# Patient Record
Sex: Male | Born: 1955 | Race: White | Hispanic: No | Marital: Married | Smoking: Never smoker
Health system: Southern US, Community
[De-identification: ages and names within clinical notes are randomized; demographics above are authoritative.]

## PROBLEM LIST (undated history)

## (undated) DIAGNOSIS — K219 Gastro-esophageal reflux disease without esophagitis: Secondary | ICD-10-CM

## (undated) DIAGNOSIS — I10 Essential (primary) hypertension: Secondary | ICD-10-CM

## (undated) DIAGNOSIS — N189 Chronic kidney disease, unspecified: Secondary | ICD-10-CM

---

## 2004-07-21 ENCOUNTER — Ambulatory Visit: Payer: Self-pay | Admitting: Gastroenterology

## 2015-05-28 ENCOUNTER — Encounter: Admission: RE | Payer: Self-pay | Source: Ambulatory Visit

## 2015-05-28 ENCOUNTER — Ambulatory Visit
Admission: RE | Admit: 2015-05-28 | Payer: BLUE CROSS/BLUE SHIELD | Source: Ambulatory Visit | Admitting: Gastroenterology

## 2015-05-28 SURGERY — COLONOSCOPY WITH PROPOFOL
Anesthesia: General

## 2015-07-29 ENCOUNTER — Encounter: Payer: Self-pay | Admitting: *Deleted

## 2015-07-30 ENCOUNTER — Ambulatory Visit: Payer: BLUE CROSS/BLUE SHIELD | Admitting: Anesthesiology

## 2015-07-30 ENCOUNTER — Encounter: Payer: Self-pay | Admitting: *Deleted

## 2015-07-30 ENCOUNTER — Encounter
Admission: RE | Disposition: A | Discharge status: Home / Self Care | Payer: Self-pay | Source: Ambulatory Visit | Attending: Gastroenterology

## 2015-07-30 ENCOUNTER — Ambulatory Visit
Admission: RE | Admit: 2015-07-30 | Discharge: 2015-07-30 | Disposition: A | Discharge status: Home / Self Care | Payer: BLUE CROSS/BLUE SHIELD | Source: Ambulatory Visit | Attending: Gastroenterology | Admitting: Gastroenterology

## 2015-07-30 DIAGNOSIS — Z79899 Other long term (current) drug therapy: Secondary | ICD-10-CM | POA: Diagnosis not present

## 2015-07-30 DIAGNOSIS — Z1211 Encounter for screening for malignant neoplasm of colon: Secondary | ICD-10-CM | POA: Insufficient documentation

## 2015-07-30 DIAGNOSIS — K5730 Diverticulosis of large intestine without perforation or abscess without bleeding: Secondary | ICD-10-CM | POA: Insufficient documentation

## 2015-07-30 DIAGNOSIS — N189 Chronic kidney disease, unspecified: Secondary | ICD-10-CM | POA: Insufficient documentation

## 2015-07-30 DIAGNOSIS — Z87442 Personal history of urinary calculi: Secondary | ICD-10-CM | POA: Insufficient documentation

## 2015-07-30 DIAGNOSIS — I129 Hypertensive chronic kidney disease with stage 1 through stage 4 chronic kidney disease, or unspecified chronic kidney disease: Secondary | ICD-10-CM | POA: Diagnosis not present

## 2015-07-30 DIAGNOSIS — K219 Gastro-esophageal reflux disease without esophagitis: Secondary | ICD-10-CM | POA: Diagnosis not present

## 2015-07-30 HISTORY — DX: Gastro-esophageal reflux disease without esophagitis: K21.9

## 2015-07-30 HISTORY — PX: COLONOSCOPY WITH PROPOFOL: SHX5780

## 2015-07-30 HISTORY — DX: Chronic kidney disease, unspecified: N18.9

## 2015-07-30 HISTORY — DX: Essential (primary) hypertension: I10

## 2015-07-30 SURGERY — COLONOSCOPY WITH PROPOFOL
Anesthesia: General

## 2015-07-30 MED ORDER — FENTANYL CITRATE (PF) 100 MCG/2ML IJ SOLN
INTRAMUSCULAR | Status: DC | PRN
  Administered 2015-07-30: 50 ug via INTRAVENOUS

## 2015-07-30 MED ORDER — SODIUM CHLORIDE 0.9 % IV SOLN
INTRAVENOUS | Status: DC
  Administered 2015-07-30: 11:00:00 via INTRAVENOUS

## 2015-07-30 MED ORDER — PROPOFOL 10 MG/ML IV BOLUS
INTRAVENOUS | Status: DC | PRN
  Administered 2015-07-30: 40 mg via INTRAVENOUS

## 2015-07-30 MED ORDER — PROPOFOL 500 MG/50ML IV EMUL
INTRAVENOUS | Status: DC | PRN
  Administered 2015-07-30: 120 ug/kg/min via INTRAVENOUS

## 2015-07-30 MED ORDER — MIDAZOLAM HCL 2 MG/2ML IJ SOLN
INTRAMUSCULAR | Status: DC | PRN
  Administered 2015-07-30: 1 mg via INTRAVENOUS

## 2015-07-30 MED ORDER — PHENYLEPHRINE HCL 10 MG/ML IJ SOLN
INTRAMUSCULAR | Status: DC | PRN
  Administered 2015-07-30: 100 ug via INTRAVENOUS

## 2015-07-30 MED ORDER — SODIUM CHLORIDE 0.9 % IV SOLN: INTRAVENOUS | Status: DC

## 2015-07-30 NOTE — Anesthesia Preprocedure Evaluation (Signed)
Anesthesia Evaluation  Patient identified by MRN, date of birth, ID band Patient awake    Reviewed: Allergy & Precautions, NPO status , Patient's Chart, lab work & pertinent test results  History of Anesthesia Complications Negative for: history of anesthetic complications  Airway Mallampati: II       Dental no notable dental hx.    Pulmonary neg pulmonary ROS,    breath sounds clear to auscultation       Cardiovascular Exercise Tolerance: Good hypertension, Pt. on medications  Rhythm:Regular Rate:Normal     Neuro/Psych    GI/Hepatic Neg liver ROS, GERD  Medicated,  Endo/Other  negative endocrine ROS  Renal/GU      Musculoskeletal   Abdominal Normal abdominal exam  (+)   Peds  Hematology   Anesthesia Other Findings   Reproductive/Obstetrics                             Anesthesia Physical Anesthesia Plan  ASA: I  Anesthesia Plan: General   Post-op Pain Management:    Induction: Intravenous  Airway Management Planned: Natural Airway and Nasal Cannula  Additional Equipment:   Intra-op Plan:   Post-operative Plan:   Informed Consent: I have reviewed the patients History and Physical, chart, labs and discussed the procedure including the risks, benefits and alternatives for the proposed anesthesia with the patient or authorized representative who has indicated his/her understanding and acceptance.     Plan Discussed with:   Anesthesia Plan Comments:         Anesthesia Quick Evaluation

## 2015-07-30 NOTE — Transfer of Care (Signed)
Immediate Anesthesia Transfer of Care Note  Patient: Philip Terry  Procedure(s) Performed: Procedure(s): COLONOSCOPY WITH PROPOFOL (N/A)  Patient Location: PACU  Anesthesia Type:General  Level of Consciousness: awake, alert  and oriented  Airway & Oxygen Therapy: Patient Spontanous Breathing and Patient connected to nasal cannula oxygen  Post-op Assessment: Report given to RN and Post -op Vital signs reviewed and stable  Post vital signs: Reviewed and stable  Last Vitals:  Filed Vitals:   07/30/15 1053  BP: 118/76  Pulse: 98  Temp: 36.1 C  Resp: 16    Complications: No apparent anesthesia complications

## 2015-07-30 NOTE — H&P (Signed)
    Primary Care Physician:  Rusty Aus., MD Primary Gastroenterologist:  Dr. Candace Cruise  Pre-Procedure History & Physical: HPI:  Philip Terry is a 60 y.o. male is here for an colonoscopy.   Past Medical History  Diagnosis Date  . GERD (gastroesophageal reflux disease)   . Hypertension   . Chronic kidney disease     Nephrolithiasis    History reviewed. No pertinent past surgical history.  Prior to Admission medications   Medication Sig Start Date End Date Taking? Authorizing Provider  lisinopril-hydrochlorothiazide (PRINZIDE,ZESTORETIC) 20-12.5 MG tablet Take 1 tablet by mouth daily.   Yes Historical Provider, MD    Allergies as of 05/31/2015  . (Not on File)    History reviewed. No pertinent family history.  Social History   Social History  . Marital Status: Married    Spouse Name: N/A  . Number of Children: N/A  . Years of Education: N/A   Occupational History  . Not on file.   Social History Main Topics  . Smoking status: Never Smoker   . Smokeless tobacco: Never Used  . Alcohol Use: No  . Drug Use: No  . Sexual Activity: Not on file   Other Topics Concern  . Not on file   Social History Narrative    Review of Systems: See HPI, otherwise negative ROS  Physical Exam: BP 118/76 mmHg  Pulse 98  Temp(Src) 97 F (36.1 C) (Tympanic)  Resp 16  Ht 5\' 6"  (1.676 m)  Wt 68.04 kg (150 lb)  BMI 24.22 kg/m2  SpO2 100% General:   Alert,  pleasant and cooperative in NAD Head:  Normocephalic and atraumatic. Neck:  Supple; no masses or thyromegaly. Lungs:  Clear throughout to auscultation.    Heart:  Regular rate and rhythm. Abdomen:  Soft, nontender and nondistended. Normal bowel sounds, without guarding, and without rebound.   Neurologic:  Alert and  oriented x4;  grossly normal neurologically.  Impression/Plan: Philip Terry is here for an colonoscopy to be performed for screening.  Risks, benefits, limitations, and alternatives regarding  colonoscopy  have been reviewed with the patient.  Questions have been answered.  All parties agreeable.   Margean Korell, Lupita Dawn, MD  07/30/2015, 10:55 AM

## 2015-07-30 NOTE — Op Note (Signed)
[facility] Gastroenterology Patient Name: Philip Terry Procedure Date: 07/30/2015 11:15 AM MRN: [id number] Account #: [id number] Date of Birth: 1955/09/16 Admit Type: Outpatient Age: 60 Room: [facility] ENDO ROOM 4 Gender: Male Note Status: Finalized Procedure:         Colonoscopy Indications:       Screening for colorectal malignant neoplasm Providers:         Lupita Dawn. Candace Cruise, MD Referring MD:      Rusty Aus, MD (Referring MD) Medicines:         Monitored Anesthesia Care Complications:     No immediate complications. Procedure:         Pre-Anesthesia Assessment:                    - Prior to the procedure, a History and Physical was                     performed, and patient medications, allergies and                     sensitivities were reviewed. The patient's tolerance of                     previous anesthesia was reviewed.                    - The risks and benefits of the procedure and the sedation                     options and risks were discussed with the patient. All                     questions were answered and informed consent was obtained.                    - After reviewing the risks and benefits, the patient was                     deemed in satisfactory condition to undergo the procedure.                    After obtaining informed consent, the colonoscope was                     passed under direct vision. Throughout the procedure, the                     patient's blood pressure, pulse, and oxygen saturations                     were monitored continuously. The Colonoscope was                     introduced through the anus and advanced to the the cecum,                     identified by appendiceal orifice and ileocecal valve. The                     colonoscopy was performed without difficulty. The patient                     tolerated the procedure well. The quality of the bowel  preparation was good. Findings:      A few  small-mouthed diverticula were found in the sigmoid colon.      The exam was otherwise without abnormality. Impression:        - Diverticulosis in the sigmoid colon.                    - The examination was otherwise normal.                    - No specimens collected. Recommendation:    - Discharge patient to home.                    - Repeat colonoscopy in 10 years for surveillance.                    - The findings and recommendations were discussed with the                     patient. Procedure Code(s): --- Professional ---                    [phone], Colonoscopy, flexible; diagnostic, including                     collection of specimen(s) by brushing or washing, when                     performed (separate procedure) Diagnosis Code(s): --- Professional ---                    Z12.11, Encounter for screening for malignant neoplasm of                     colon                    K57.30, Diverticulosis of large intestine without                     perforation or abscess without bleeding CPT copyright 2014 American Medical Association. All rights reserved. The codes documented in this report are preliminary and upon coder review may  be revised to meet current compliance requirements. Hulen Luster, MD 07/30/2015 11:29:14 AM This report has been signed electronically. Number of Addenda: 0 Note Initiated On: 07/30/2015 11:15 AM Scope Withdrawal Time: 0 hours 3 minutes 58 seconds  Total Procedure Duration: 0 hours 6 minutes 56 seconds       [facility]

## 2015-07-30 NOTE — Anesthesia Postprocedure Evaluation (Signed)
Anesthesia Post Note  Patient: Philip Terry  Procedure(s) Performed: Procedure(s) (LRB): COLONOSCOPY WITH PROPOFOL (N/A)  Patient location during evaluation: PACU Anesthesia Type: General Level of consciousness: awake Pain management: satisfactory to patient Vital Signs Assessment: post-procedure vital signs reviewed and stable Respiratory status: spontaneous breathing Cardiovascular status: stable Anesthetic complications: no    Last Vitals:  Filed Vitals:   07/30/15 1053  BP: 118/76  Pulse: 98  Temp: 36.1 C  Resp: 16    Last Pain: There were no vitals filed for this visit.               VAN STAVEREN,Dakwan Pridgen

## 2021-05-30 ENCOUNTER — Ambulatory Visit (INDEPENDENT_AMBULATORY_CARE_PROVIDER_SITE_OTHER): Payer: Medicare HMO | Admitting: Dermatology

## 2021-05-30 ENCOUNTER — Other Ambulatory Visit: Payer: Self-pay

## 2021-05-30 DIAGNOSIS — Z1283 Encounter for screening for malignant neoplasm of skin: Secondary | ICD-10-CM | POA: Diagnosis not present

## 2021-05-30 DIAGNOSIS — D229 Melanocytic nevi, unspecified: Secondary | ICD-10-CM

## 2021-05-30 DIAGNOSIS — D1801 Hemangioma of skin and subcutaneous tissue: Secondary | ICD-10-CM

## 2021-05-30 DIAGNOSIS — L821 Other seborrheic keratosis: Secondary | ICD-10-CM

## 2021-05-30 DIAGNOSIS — S91302A Unspecified open wound, left foot, initial encounter: Secondary | ICD-10-CM

## 2021-05-30 DIAGNOSIS — L814 Other melanin hyperpigmentation: Secondary | ICD-10-CM | POA: Diagnosis not present

## 2021-05-30 DIAGNOSIS — L578 Other skin changes due to chronic exposure to nonionizing radiation: Secondary | ICD-10-CM | POA: Diagnosis not present

## 2021-05-30 DIAGNOSIS — T148XXA Other injury of unspecified body region, initial encounter: Secondary | ICD-10-CM

## 2021-05-30 MED ORDER — MUPIROCIN 2 % EX OINT
1.0000 | TOPICAL_OINTMENT | Freq: Every day | CUTANEOUS | 0 refills | Status: AC
Start: 2021-05-30 — End: ?

## 2021-05-30 NOTE — Patient Instructions (Signed)
Melanoma ABCDEs  Melanoma is the most dangerous type of skin cancer, and is the leading cause of death from skin disease.  You are more likely to develop melanoma if you: Have light-colored skin, light-colored eyes, or red or blond hair Spend a lot of time in the sun Tan regularly, either outdoors or in a tanning bed Have had blistering sunburns, especially during childhood Have a close family member who has had a melanoma Have atypical moles or large birthmarks  Early detection of melanoma is key since treatment is typically straightforward and cure rates are extremely high if we catch it early.   The first sign of melanoma is often a change in a mole or a new dark spot.  The ABCDE system is a way of remembering the signs of melanoma.  A for asymmetry:  The two halves do not match. B for border:  The edges of the growth are irregular. C for color:  A mixture of colors are present instead of an even brown color. D for diameter:  Melanomas are usually (but not always) greater than 9m - the size of a pencil eraser. E for evolution:  The spot keeps changing in size, shape, and color.  Please check your skin once per month between visits. You can use a small mirror in front and a large mirror behind you to keep an eye on the back side or your body.   If you see any new or changing lesions before your next follow-up, please call to schedule a visit.  Please continue daily skin protection including broad spectrum sunscreen SPF 30+ to sun-exposed areas, reapplying every 2 hours as needed when you're outdoors.    If You Need Anything After Your Visit  If you have any questions or concerns for your doctor, please call our main line at [phone] press option 4 to reach your doctor's medical assistant. If no one answers, please leave a voicemail as directed and we will return your call as soon as possible. Messages left after 4 pm will be answered the following business day.   You may also  send [place] message via [place] We typically respond to MyChart messages within 1-2 business days.  For prescription refills, please ask your pharmacy to contact our office. Our fax number is [phone]  If you have an urgent issue when the clinic is closed that cannot wait until the next business day, you can page your doctor at the number below.    Please note that while we do our best to be available for urgent issues outside of office hours, we are not available 24/7.   If you have an urgent issue and are unable to reach [place] you may choose to seek medical care at your doctor's office, retail clinic, urgent care center, or emergency room.  If you have a medical emergency, please immediately call 911 or go to the emergency department.  Pager Numbers  - Dr. KNehemiah Massed [phone] - Dr. MLaurence Ferrari [phone] - Dr. SNicole Kindred [phone] In the event of inclement weather, please call our main line at [phone] an update on the status of any delays or closures.  Dermatology Medication Tips: Please keep the boxes that topical medications come in in order to help keep track of the instructions about where and how to use these. Pharmacies typically print the medication instructions only on the boxes and not directly on the medication tubes.   If your medication is too expensive, please contact  our office at [phone] option 4 or send [place] a message through [place].   We are unable to tell what your co-pay for medications will be in advance as this is different depending on your insurance coverage. However, we may be able to find a substitute medication at lower cost or fill out paperwork to get insurance to cover a needed medication.   If a prior authorization is required to get your medication covered by your insurance company, please allow [place] 1-2 business days to complete this process.  Drug prices often vary depending on where the prescription is filled and some pharmacies may  offer cheaper prices.  The website www.goodrx.com contains coupons for medications through different pharmacies. The prices here do not account for what the cost may be with help from insurance (it may be cheaper with your insurance), but the website can give you the price if you did not use any insurance.  - You can print the associated coupon and take it with your prescription to the pharmacy.  - You may also stop by our office during regular business hours and pick up a GoodRx coupon card.  - If you need your prescription sent electronically to a different pharmacy, notify our office through [facility] or by phone at [phone] option 4.     Si Usted Necesita Algo Despus de Su Visita  Tambin puede enviarnos un mensaje a travs de Pharmacist, community. Por lo general respondemos a los mensajes de MyChart en el transcurso de 1 a 2 das hbiles.  Para renovar recetas, por favor pida a su farmacia que se ponga en contacto con nuestra oficina. Harland Dingwall de fax es [place] [phone].  Si tiene un asunto urgente cuando la clnica est cerrada y que no puede esperar hasta el siguiente da hbil, puede llamar/localizar a su doctor(a) al nmero que aparece a continuacin.   Por favor, tenga en cuenta que aunque hacemos todo lo posible para estar disponibles para asuntos urgentes fuera del horario de [place], no estamos disponibles las 24 horas del da, los 7 das de la [place].   Si tiene un problema urgente y no puede comunicarse con nosotros, puede optar por buscar atencin mdica  en el consultorio de su doctor(a), en una clnica privada, en un centro de atencin urgente o en una sala de emergencias.  Si tiene Engineering geologist, por favor llame inmediatamente al 911 o vaya a la sala de emergencias.  Nmeros de bper  - Dr. Nehemiah Massed: [phone]  - Dra. Moye: [phone]  - Dra. Nicole Kindred: [phone]  En caso de inclemencias del [place], por favor llame a Johnsie Kindred principal al  [phone] para una actualizacin sobre el [place] de cualquier retraso o cierre.  Consejos para la medicacin en dermatologa: Por favor, guarde las cajas en las que vienen los medicamentos de uso tpico para ayudarle a seguir las instrucciones sobre dnde y cmo usarlos. Las farmacias generalmente imprimen las instrucciones del medicamento slo en las cajas y no directamente en los tubos del [place].   Si su medicamento es muy caro, por favor, pngase en contacto con Zigmund Daniel llamando al [phone] y presione la opcin 4 o envenos un mensaje a travs de Pharmacist, community.   No podemos decirle cul ser su copago por los medicamentos por adelantado ya que esto es diferente dependiendo de la cobertura de su seguro. Sin embargo, es posible que podamos encontrar un medicamento sustituto a Electrical engineer un formulario para que el seguro cubra el medicamento  que se considera necesario.   Si se requiere una autorizacin previa para que su compaa de seguros [place] su medicamento, por favor permtanos de 1 a 2 das hbiles para completar este proceso.  Los precios de los medicamentos varan con frecuencia dependiendo del Environmental consultant de dnde se surte la receta y alguna farmacias pueden ofrecer precios ms baratos.  El sitio web www.goodrx.com tiene cupones para medicamentos de Airline pilot. Los precios aqu no tienen en cuenta lo que podra costar con la ayuda del seguro (puede ser ms barato con su seguro), pero el sitio web puede darle el precio si no utiliz Research scientist (physical sciences).  - Puede imprimir el cupn correspondiente y llevarlo con su receta a la farmacia.  - Tambin puede pasar por nuestra oficina durante el horario de atencin regular y Charity fundraiser una tarjeta de cupones de GoodRx.  - Si necesita que su receta se enve electrnicamente a una farmacia diferente, informe a nuestra oficina a travs de MyChart de [place] o por telfono llamando al [phone] y presione la opcin 4.

## 2021-05-30 NOTE — Progress Notes (Signed)
   Follow-Up Visit   Subjective  Philip Terry is a 65 y.o. male who presents for the following: Annual Exam (Patient here for full body skin exam and skin cancer screening. Patient with no hx of skin cancer or dysplastic nevi. He is not aware of any new or changing spots but has had a sore at left foot for 3-4 days, patient may have stepped on something. ).  The following portions of the chart were reviewed this encounter and updated as appropriate:   Tobacco  Allergies  Meds  Problems  Med Hx  Surg Hx  Fam Hx     Review of Systems:  No other skin or systemic complaints except as noted in HPI or Assessment and Plan.  Objective  Well appearing patient in no apparent distress; mood and affect are within normal limits.  A full examination was performed including scalp, head, eyes, ears, nose, lips, neck, chest, axillae, abdomen, back, buttocks, bilateral upper extremities, bilateral lower extremities, hands, feet, fingers, toes, fingernails, and toenails. All findings within normal limits unless otherwise noted below.  left foot sole Fissure    Assessment & Plan  Open wound left foot sole Start mupirocin daily. mupirocin ointment (BACTROBAN) 2 % - left foot sole Apply 1 application topically daily. With dressing changes  Skin cancer screening  Lentigines - Scattered tan macules - Due to sun exposure - Benign-appearing, observe - Recommend daily broad spectrum sunscreen SPF 30+ to sun-exposed areas, reapply every 2 hours as needed. - Call for any changes  Seborrheic Keratoses - Stuck-on, waxy, tan-brown papules and/or plaques  - Benign-appearing - Discussed benign etiology and prognosis. - Observe - Call for any changes  Melanocytic Nevi - Tan-brown and/or pink-flesh-colored symmetric macules and papules - Benign appearing on exam today - Observation - Call clinic for new or changing moles - Recommend daily use of broad spectrum spf 30+ sunscreen to sun-exposed  areas.   Hemangiomas - Red papules - Discussed benign nature - Observe - Call for any changes  Actinic Damage - Chronic condition, secondary to cumulative UV/sun exposure - diffuse scaly erythematous macules with underlying dyspigmentation - Recommend daily broad spectrum sunscreen SPF 30+ to sun-exposed areas, reapply every 2 hours as needed.  - Staying in the shade or wearing long sleeves, sun glasses (UVA+UVB protection) and wide brim hats (4-inch brim around the entire circumference of the hat) are also recommended for sun protection.  - Call for new or changing lesions.  Skin cancer screening performed today.  Return in about 1 year (around 05/30/2022) for TBSE .  Graciella Belton, RMA, am acting as scribe for Sarina Ser, MD . Documentation: I have reviewed the above documentation for accuracy and completeness, and I agree with the above.  Sarina Ser, MD

## 2021-06-08 ENCOUNTER — Encounter: Payer: Self-pay | Admitting: Dermatology

## 2021-08-26 ENCOUNTER — Other Ambulatory Visit: Payer: Self-pay | Admitting: Family Medicine

## 2021-08-26 ENCOUNTER — Ambulatory Visit
Admission: RE | Admit: 2021-08-26 | Discharge: 2021-08-26 | Disposition: A | Discharge status: Home / Self Care | Payer: Medicare HMO | Source: Ambulatory Visit | Attending: Family Medicine | Admitting: Family Medicine

## 2021-08-26 DIAGNOSIS — R109 Unspecified abdominal pain: Secondary | ICD-10-CM | POA: Diagnosis present

## 2021-08-26 DIAGNOSIS — R1031 Right lower quadrant pain: Secondary | ICD-10-CM

## 2021-08-26 IMAGING — CT CT ABD-PELV W/ CM
2 of 5 series · 15 of 46 positions shown, 17 images · IV contrast (APPLIED)
Comparison: None.

CLINICAL DATA: Right lower quadrant and right flank pain.

EXAM:
CT ABDOMEN AND PELVIS WITH CONTRAST
TECHNIQUE: Multidetector CT imaging of the abdomen and pelvis was performed
using the standard protocol following bolus administration of
intravenous contrast.

[Series 2: axial st · axial · 0.66mm/px · z∈[-987,-537]mm · 12 of 102 slices shown, 14 images]
[im 6/102  soft-tissue]
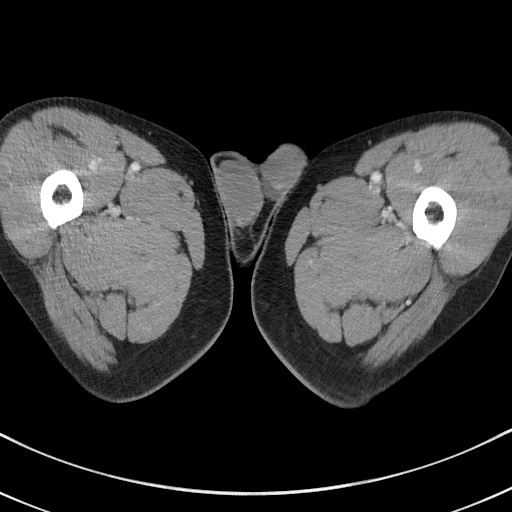
[im 6/102  bone]
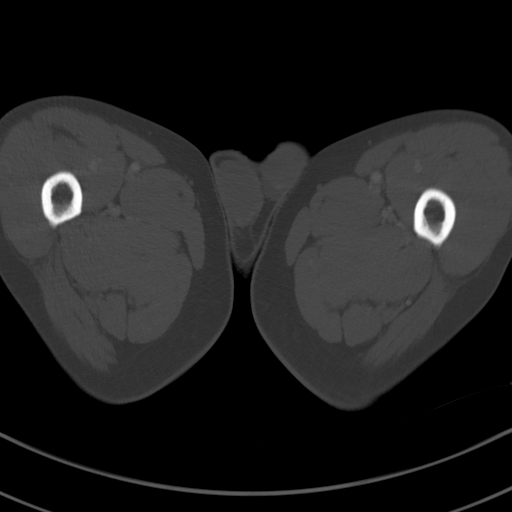
[im 16/102  soft-tissue]
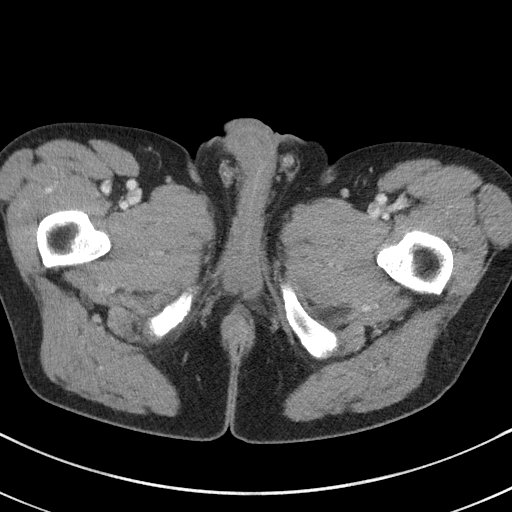
[im 22/102  soft-tissue]
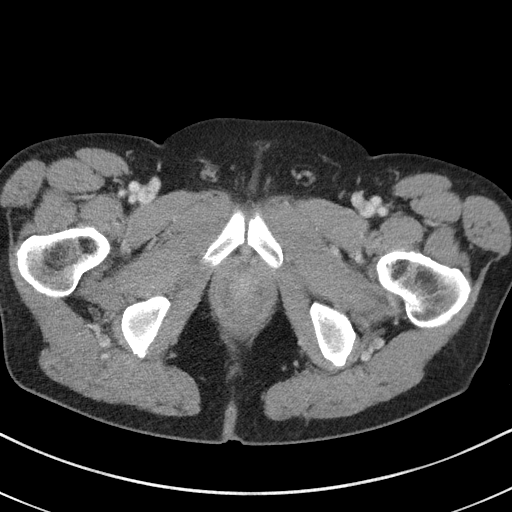
[im 32/102  soft-tissue]
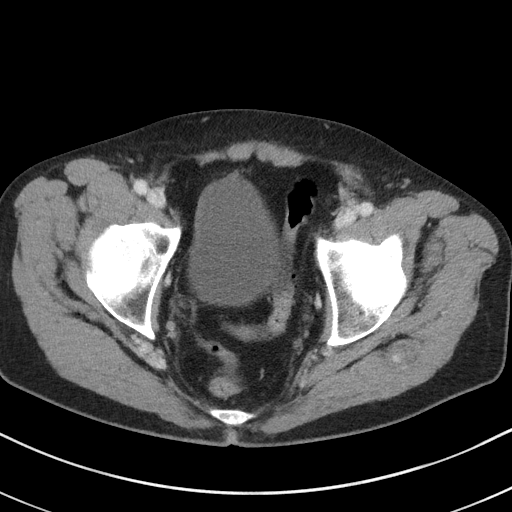
[im 38/102  soft-tissue]
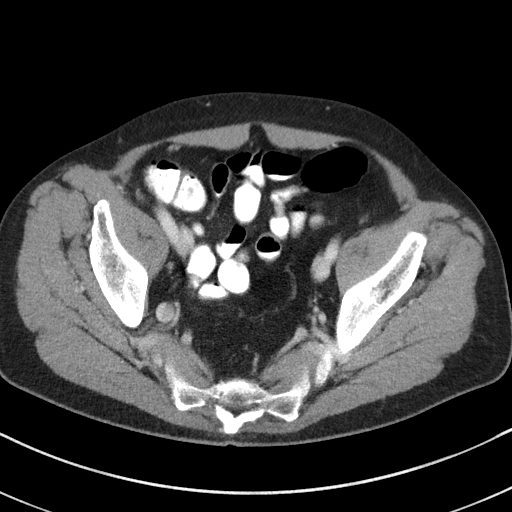
[im 48/102  soft-tissue]
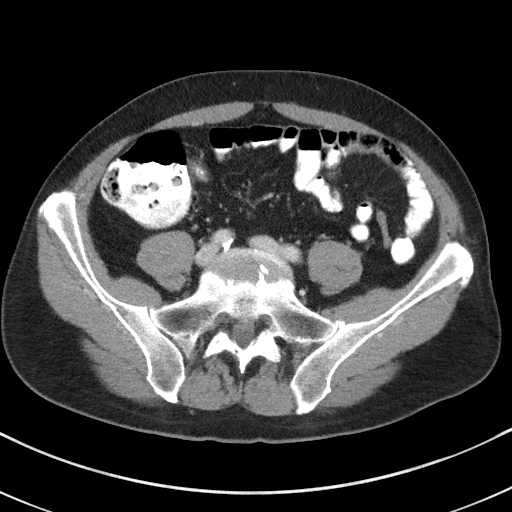
[im 54/102  soft-tissue]
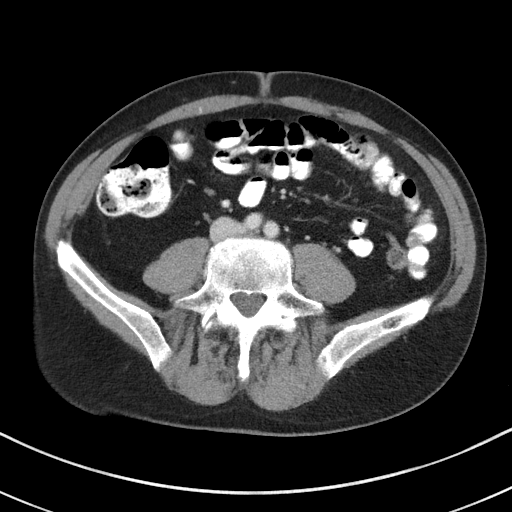
[im 64/102  soft-tissue]
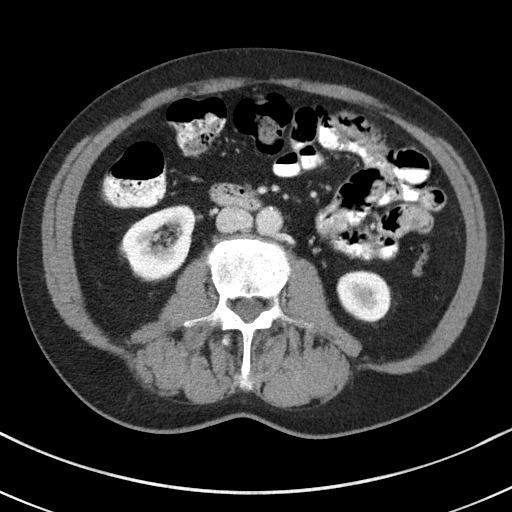
[im 70/102  soft-tissue]
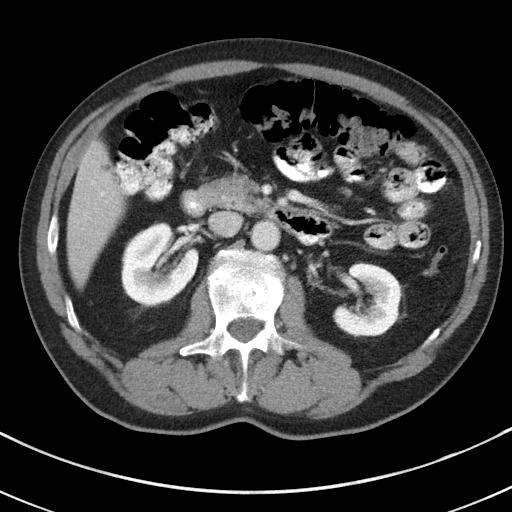
[im 70/102  bone]
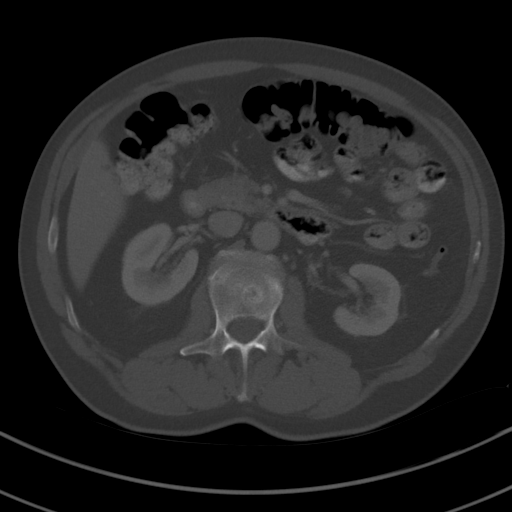
[im 80/102  soft-tissue]
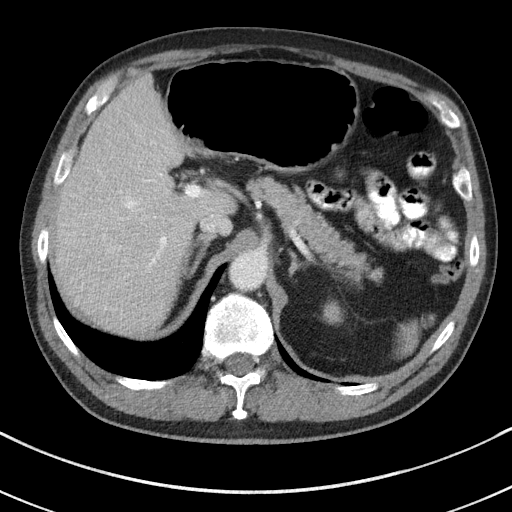
[im 86/102  soft-tissue]
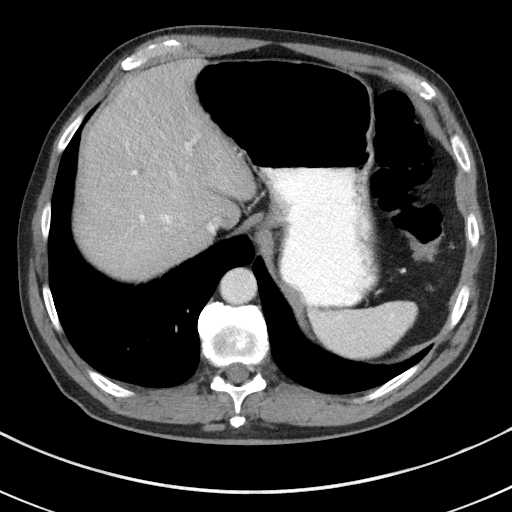
[im 96/102  soft-tissue]
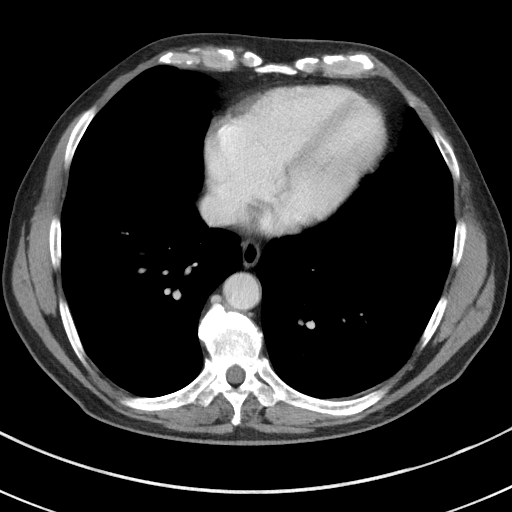

[Series 4: coronal st · coronal · 0.73mm/px · 3 of 89 slices shown]
[im 30/89  soft-tissue]
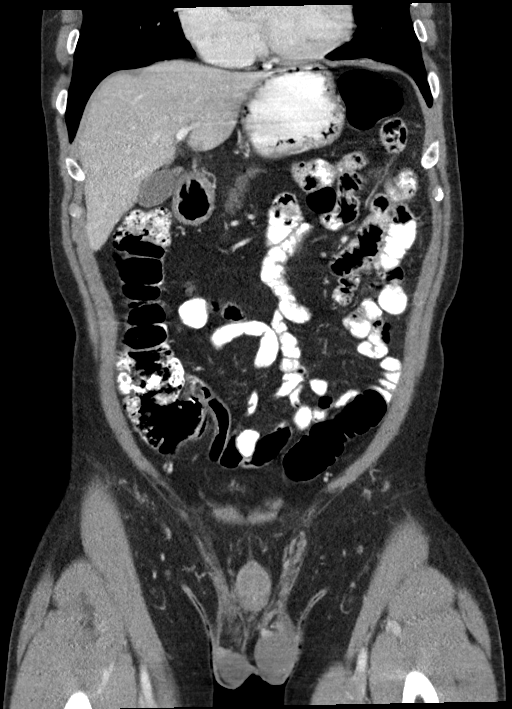
[im 40/89  soft-tissue]
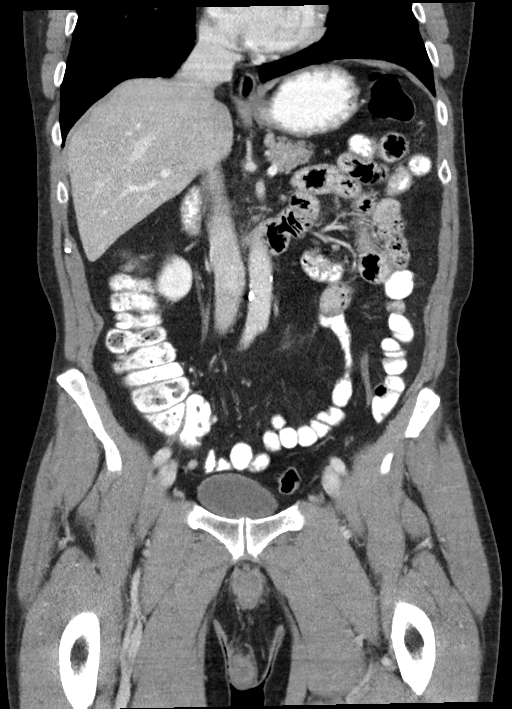
[im 49/89  soft-tissue]
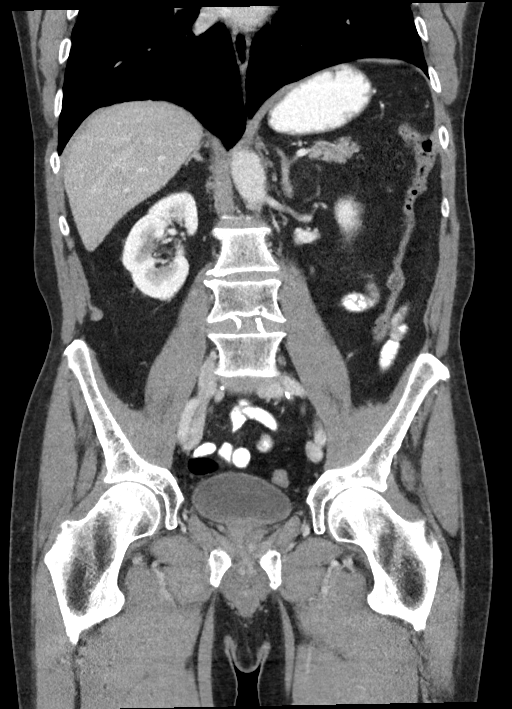

[15 of 46 positions shown; findings below may reference images not displayed]

RADIATION DOSE REDUCTION: This exam was performed according to the
departmental dose-optimization program which includes automated
exposure control, adjustment of the mA and/or kV according to
patient size and/or use of iterative reconstruction technique.

CONTRAST:  100mL OMNIPAQUE IOHEXOL 300 MG/ML  SOLN
FINDINGS: Lower chest: No acute abnormality.

Hepatobiliary: Within the superior right lobe, there is a roughly
1.5 cm area which demonstrates globular peripheral contrast
enhancement and is most likely consistent with a hemangioma based on
appearance. No gallstones, gallbladder wall thickening, or biliary
dilatation.

Pancreas: Unremarkable. No pancreatic ductal dilatation or
surrounding inflammatory changes.

Spleen: Normal in size without focal abnormality.

Adrenals/Urinary Tract: Adrenal glands are unremarkable. Kidneys are
normal, without renal calculi, focal lesion, or hydronephrosis.
Bladder is unremarkable.

Stomach/Bowel: Bowel shows no evidence of obstruction, ileus,
inflammation or lesion. No free intraperitoneal air identified. The
appendix is normal in appearance and is filled with oral contrast
with diameter of approximately 6 mm. Mild diverticulosis of the
sigmoid colon.

Vascular/Lymphatic: Atherosclerosis of the abdominal aorta and
common iliac arteries without evidence of aneurysm.

Reproductive: Prostate is unremarkable.

Other: No abdominal wall hernia or abnormality. No abdominopelvic
ascites.

Musculoskeletal: No acute or significant osseous findings.
IMPRESSION: 1. No acute findings in the abdomen or pelvis. No evidence of acute
appendicitis.
2. No evidence of renal calculi or hydronephrosis.
3. Mild diverticulosis of the sigmoid colon without evidence of
acute diverticulitis.
4. 1.5 cm hemangioma in the liver has a benign appearance.
5. Atherosclerosis of the abdominal aorta without aneurysm.

## 2021-08-26 MED ORDER — IOHEXOL 300 MG/ML  SOLN
100.0000 mL | Freq: Once | INTRAMUSCULAR | Status: AC | PRN
  Administered 2021-08-26: 100 mL via INTRAVENOUS

## 2022-02-15 ENCOUNTER — Ambulatory Visit: Payer: Medicare HMO | Admitting: Dermatology

## 2022-02-15 DIAGNOSIS — R21 Rash and other nonspecific skin eruption: Secondary | ICD-10-CM

## 2022-02-15 NOTE — Progress Notes (Signed)
   Follow-Up Visit   Subjective  Philip Terry is a 66 y.o. male who presents for the following: Rash (Patient here today for rash everywhere except for arms, started about 3-4 days ago. Spots do not really itch and he has not used anything on it. About 2 weeks ago he had rash at arms only but has cleared. His son had the same type of spots on his arms but also cleared. ).  Patient did have Covid a few weeks ago. Patient with fhx heart disease, had a recent   The following portions of the chart were reviewed this encounter and updated as appropriate:   Tobacco  Allergies  Meds  Problems  Med Hx  Surg Hx  Fam Hx      Review of Systems:  No other skin or systemic complaints except as noted in HPI or Assessment and Plan.  Objective  Well appearing patient in no apparent distress; mood and affect are within normal limits.  A focused examination was performed including back, arms, legs, feet. Relevant physical exam findings are noted in the Assessment and Plan.  right anterior hip Multiple petechiae R/o Vasculitis        Assessment & Plan  Rash right anterior hip  Skin / nail biopsy - right anterior hip Type of biopsy: punch   Informed consent: discussed and consent obtained   Timeout: patient name, date of birth, surgical site, and procedure verified   Procedure prep:  Patient was prepped and draped in usual sterile fashion Prep type:  Isopropyl alcohol Anesthesia: the lesion was anesthetized in a standard fashion   Anesthetic:  1% lidocaine w/ epinephrine 1-100,000 buffered w/ 8.4% NaHCO3 Punch size:  4 mm Suture size:  4-0 Suture type: Prolene (polypropylene)   Suture removal (days):  14 Hemostasis achieved with: suture   Outcome: patient tolerated procedure well   Post-procedure details: wound care instructions given   Additional details:  Petrolatum and a pressure dressing were applied  Specimen 1 - Surgical pathology Differential Diagnosis: R/o  Vasculitis  Check Margins: No Multiple petechiae   Related Procedures CMP Urinalysis   Return in about 2 weeks (around 03/01/2022) for Suture Removal.  Graciella Belton, RMA, am acting as scribe for Forest Gleason, MD .  Documentation: I have reviewed the above documentation for accuracy and completeness, and I agree with the above.  Forest Gleason, MD

## 2022-02-15 NOTE — Patient Instructions (Signed)
Wound Care Instructions  Cleanse wound gently with soap and water once a day then pat dry with clean gauze. Apply a thin coat of Petrolatum (petroleum jelly, "Vaseline") over the wound (unless you have an allergy to this). We recommend that you use a new, sterile tube of Vaseline. Do not pick or remove scabs. Do not remove the yellow or white "healing tissue" from the base of the wound.  Cover the wound with fresh, clean, nonstick gauze and secure with paper tape. You may use Band-Aids in place of gauze and tape if the wound is small enough, but would recommend trimming much of the tape off as there is often too much. Sometimes Band-Aids can irritate the skin.  You should call the office for your biopsy report after 1 week if you have not already been contacted.  If you experience any problems, such as abnormal amounts of bleeding, swelling, significant bruising, significant pain, or evidence of infection, please call the office immediately.  FOR ADULT SURGERY PATIENTS: If you need something for pain relief you may take 1 extra strength Tylenol (acetaminophen) AND 2 Ibuprofen (200mg each) together every 4 hours as needed for pain. (do not take these if you are allergic to them or if you have a reason you should not take them.) Typically, you may only need pain medication for 1 to 3 days.     Due to recent changes in healthcare laws, you may see results of your pathology and/or laboratory studies on MyChart before the doctors have had a chance to review them. We understand that in some cases there may be results that are confusing or concerning to you. Please understand that not all results are received at the same time and often the doctors may need to interpret multiple results in order to provide you with the best plan of care or course of treatment. Therefore, we ask that you please give us 2 business days to thoroughly review all your results before contacting the office for clarification. Should  we see a critical lab result, you will be contacted sooner.   If You Need Anything After Your Visit  If you have any questions or concerns for your doctor, please call our main line at [phone] and press option 4 to reach your doctor's medical assistant. If no one answers, please leave a voicemail as directed and we will return your call as soon as possible. Messages left after 4 pm will be answered the following business day.   You may also send us a message via MyChart. We typically respond to MyChart messages within 1-2 business days.  For prescription refills, please ask your pharmacy to contact our office. Our fax number is [phone].  If you have an urgent issue when the clinic is closed that cannot wait until the next business day, you can page your doctor at the number below.    Please note that while we do our best to be available for urgent issues outside of office hours, we are not available 24/7.   If you have an urgent issue and are unable to reach us, you may choose to seek medical care at your doctor's office, retail clinic, urgent care center, or emergency room.  If you have a medical emergency, please immediately call 911 or go to the emergency department.  Pager Numbers  - Dr. Kowalski: [phone]  - Dr. Moye: [phone]  - Dr. Stewart: [phone]  In the event of inclement weather, please call our main line at   [phone] for an update on the status of any delays or closures.  Dermatology Medication Tips: Please keep the boxes that topical medications come in in order to help keep track of the instructions about where and how to use these. Pharmacies typically print the medication instructions only on the boxes and not directly on the medication tubes.   If your medication is too expensive, please contact our office at [phone] option 4 or send us a message through MyChart.   We are unable to tell what your co-pay for medications will be in  advance as this is different depending on your insurance coverage. However, we may be able to find a substitute medication at lower cost or fill out paperwork to get insurance to cover a needed medication.   If a prior authorization is required to get your medication covered by your insurance company, please allow us 1-2 business days to complete this process.  Drug prices often vary depending on where the prescription is filled and some pharmacies may offer cheaper prices.  The website www.goodrx.com contains coupons for medications through different pharmacies. The prices here do not account for what the cost may be with help from insurance (it may be cheaper with your insurance), but the website can give you the price if you did not use any insurance.  - You can print the associated coupon and take it with your prescription to the pharmacy.  - You may also stop by our office during regular business hours and pick up a GoodRx coupon card.  - If you need your prescription sent electronically to a different pharmacy, notify our office through [place] MyChart or by phone at [phone] option 4.     Si Usted Necesita Algo Despus de Su Visita  Tambin puede enviarnos un mensaje a travs de MyChart. Por lo general respondemos a los mensajes de MyChart en el transcurso de 1 a 2 das hbiles.  Para renovar recetas, por favor pida a su farmacia que se ponga en contacto con nuestra oficina. Nuestro nmero de fax es el [phone].  Si tiene un asunto urgente cuando la clnica est cerrada y que no puede esperar hasta el siguiente da hbil, puede llamar/localizar a su doctor(a) al nmero que aparece a continuacin.   Por favor, tenga en cuenta que aunque hacemos todo lo posible para estar disponibles para asuntos urgentes fuera del horario de oficina, no estamos disponibles las 24 horas del da, los 7 das de la semana.   Si tiene un problema urgente y no puede comunicarse con nosotros, puede  optar por buscar atencin mdica  en el consultorio de su doctor(a), en una clnica privada, en un centro de atencin urgente o en una sala de emergencias.  Si tiene una emergencia mdica, por favor llame inmediatamente al 911 o vaya a la sala de emergencias.  Nmeros de bper  - Dr. Kowalski: [phone]  - Dra. Moye: [phone]  - Dra. Stewart: [phone]  En caso de inclemencias del tiempo, por favor llame a nuestra lnea principal al [phone] para una actualizacin sobre el estado de cualquier retraso o cierre.  Consejos para la medicacin en dermatologa: Por favor, guarde las cajas en las que vienen los medicamentos de uso tpico para ayudarle a seguir las instrucciones sobre dnde y cmo usarlos. Las farmacias generalmente imprimen las instrucciones del medicamento slo en las cajas y no directamente en los tubos del medicamento.   Si su medicamento es muy caro, por favor, pngase en contacto con   nuestra oficina llamando al [phone] y presione la opcin 4 o envenos un mensaje a travs de MyChart.   No podemos decirle cul ser su copago por los medicamentos por adelantado ya que esto es diferente dependiendo de la cobertura de su seguro. Sin embargo, es posible que podamos encontrar un medicamento sustituto a menor costo o llenar un formulario para que el seguro cubra el medicamento que se considera necesario.   Si se requiere una autorizacin previa para que su compaa de seguros cubra su medicamento, por favor permtanos de 1 a 2 das hbiles para completar este proceso.  Los precios de los medicamentos varan con frecuencia dependiendo del lugar de dnde se surte la receta y alguna farmacias pueden ofrecer precios ms baratos.  El sitio web www.goodrx.com tiene cupones para medicamentos de diferentes farmacias. Los precios aqu no tienen en cuenta lo que podra costar con la ayuda del seguro (puede ser ms barato con su seguro), pero el sitio web puede darle el  precio si no utiliz ningn seguro.  - Puede imprimir el cupn correspondiente y llevarlo con su receta a la farmacia.  - Tambin puede pasar por nuestra oficina durante el horario de atencin regular y recoger una tarjeta de cupones de GoodRx.  - Si necesita que su receta se enve electrnicamente a una farmacia diferente, informe a nuestra oficina a travs de MyChart de [place] o por telfono llamando al [phone] y presione la opcin 4.  

## 2022-02-16 ENCOUNTER — Telehealth: Payer: Self-pay

## 2022-02-16 LAB — URINALYSIS
Bilirubin, UA: NEGATIVE
Glucose, UA: NEGATIVE
Ketones, UA: NEGATIVE
Leukocytes,UA: NEGATIVE
Nitrite, UA: NEGATIVE
Protein,UA: NEGATIVE
RBC, UA: NEGATIVE
Specific Gravity, UA: 1.023 (ref 1.005–1.030)
Urobilinogen, Ur: 0.2 mg/dL (ref 0.2–1.0)
pH, UA: 6 (ref 5.0–7.5)

## 2022-02-16 LAB — COMPREHENSIVE METABOLIC PANEL
ALT: 13 IU/L (ref 0–44)
AST: 19 IU/L (ref 0–40)
Albumin/Globulin Ratio: 1.7 (ref 1.2–2.2)
Albumin: 4.5 g/dL (ref 3.9–4.9)
Alkaline Phosphatase: 61 IU/L (ref 44–121)
BUN/Creatinine Ratio: 19 (ref 10–24)
BUN: 22 mg/dL (ref 8–27)
Bilirubin Total: 0.3 mg/dL (ref 0.0–1.2)
CO2: 22 mmol/L (ref 20–29)
Calcium: 9.8 mg/dL (ref 8.6–10.2)
Chloride: 102 mmol/L (ref 96–106)
Creatinine, Ser: 1.16 mg/dL (ref 0.76–1.27)
Globulin, Total: 2.7 g/dL (ref 1.5–4.5)
Glucose: 93 mg/dL (ref 70–99)
Potassium: 4.8 mmol/L (ref 3.5–5.2)
Sodium: 139 mmol/L (ref 134–144)
Total Protein: 7.2 g/dL (ref 6.0–8.5)
eGFR: 69 mL/min/{1.73_m2} (ref >59)

## 2022-02-16 NOTE — Telephone Encounter (Signed)
-----   Message from [place], MD sent at 02/16/2022 11:49 AM EDT ----- Blood work and urine analysis are normal. No signs of issues with kidneys or liver. Will wait for biopsy results and go from there as far as treatment or deciding on any additional work-up.   MAs please call. Thank you!

## 2022-02-16 NOTE — Telephone Encounter (Signed)
Patient advised blood work and urine analysis are normal. No signs of issues with kidneys or liver. Will wait for biopsy results and go from there as far as treatment or deciding on any additional work-up.  Lurlean Horns., RMA

## 2022-02-20 ENCOUNTER — Encounter: Payer: Self-pay | Admitting: Dermatology

## 2022-02-23 ENCOUNTER — Ambulatory Visit: Payer: Medicare HMO | Admitting: Dermatology

## 2022-02-23 DIAGNOSIS — R21 Rash and other nonspecific skin eruption: Secondary | ICD-10-CM

## 2022-02-23 MED ORDER — CLOBETASOL PROPIONATE 0.05 % EX OINT: 1.0000 | TOPICAL_OINTMENT | Freq: Two times a day (BID) | CUTANEOUS | 1 refills | Status: AC

## 2022-02-23 NOTE — Patient Instructions (Addendum)
D/c neosporin  Start clobetasol 0.05% ointment twice daily to aa up to 2 weeks. Avoid applying to face, groin, and axilla. Use as directed. Long-term use can cause thinning of the skin. Topical steroids (such as triamcinolone, fluocinolone, fluocinonide, mometasone, clobetasol, halobetasol, betamethasone, hydrocortisone) can cause thinning and lightening of the skin if they are used for too long in the same area. Your physician has selected the right strength medicine for your problem and area affected on the body. Please use your medication only as directed by your physician to prevent side effects.   Recommend fragrance free laundry soap.   If not improved by appt next week, plan biopsy.   Due to recent changes in healthcare laws, you may see results of your pathology and/or laboratory studies on MyChart before the doctors have had a chance to review them. We understand that in some cases there may be results that are confusing or concerning to you. Please understand that not all results are received at the same time and often the doctors may need to interpret multiple results in order to provide you with the best plan of care or course of treatment. Therefore, we ask that you please give [place] 2 business days to thoroughly review all your results before contacting the office for clarification. Should we see a critical lab result, you will be contacted sooner.   If You Need Anything After Your Visit  If you have any questions or concerns for your doctor, please call our main line at [phone] and press option 4 to reach your doctor's medical assistant. If no one answers, please leave a voicemail as directed and we will return your call as soon as possible. Messages left after 4 pm will be answered the following business day.   You may also send [place] a message via [place]. We typically respond to MyChart messages within 1-2 business days.  For prescription refills, please ask your pharmacy to  contact our office. Our fax number is [phone].  If you have an urgent issue when the clinic is closed that cannot wait until the next business day, you can page your doctor at the number below.    Please note that while we do our best to be available for urgent issues outside of office hours, we are not available 24/7.   If you have an urgent issue and are unable to reach [place], you may choose to seek medical care at your doctor's office, retail clinic, urgent care center, or emergency room.  If you have a medical emergency, please immediately call 911 or go to the emergency department.  Pager Numbers  - Dr. Nehemiah Massed: [phone]  - Dr. Laurence Ferrari: [phone]  - Dr. Nicole Kindred: [phone]  In the event of inclement weather, please call our main line at [phone] for an update on the status of any delays or closures.  Dermatology Medication Tips: Please keep the boxes that topical medications come in in order to help keep track of the instructions about where and how to use these. Pharmacies typically print the medication instructions only on the boxes and not directly on the medication tubes.   If your medication is too expensive, please contact our office at [phone] option 4 or send [place] a message through [place].   We are unable to tell what your co-pay for medications will be in advance as this is different depending on your insurance coverage. However, we may be able to find a substitute medication at lower cost or fill out paperwork  to get insurance to cover a needed medication.   If a prior authorization is required to get your medication covered by your insurance company, please allow [place] 1-2 business days to complete this process.  Drug prices often vary depending on where the prescription is filled and some pharmacies may offer cheaper prices.  The website www.goodrx.com contains coupons for medications through different pharmacies. The prices here do not account for what  the cost may be with help from insurance (it may be cheaper with your insurance), but the website can give you the price if you did not use any insurance.  - You can print the associated coupon and take it with your prescription to the pharmacy.  - You may also stop by our office during regular business hours and pick up a GoodRx coupon card.  - If you need your prescription sent electronically to a different pharmacy, notify our office through [facility] or by phone at [phone] option 4.     Si Usted Necesita Algo Despus de Su Visita  Tambin puede enviarnos un mensaje a travs de Pharmacist, community. Por lo general respondemos a los mensajes de MyChart en el transcurso de 1 a 2 das hbiles.  Para renovar recetas, por favor pida a su farmacia que se ponga en contacto con nuestra oficina. Harland Dingwall de fax es [place] [phone].  Si tiene un asunto urgente cuando la clnica est cerrada y que no puede esperar hasta el siguiente da hbil, puede llamar/localizar a su doctor(a) al nmero que aparece a continuacin.   Por favor, tenga en cuenta que aunque hacemos todo lo posible para estar disponibles para asuntos urgentes fuera del horario de [place], no estamos disponibles las 24 horas del da, los 7 das de la [place].   Si tiene un problema urgente y no puede comunicarse con nosotros, puede optar por buscar atencin mdica  en el consultorio de su doctor(a), en una clnica privada, en un centro de atencin urgente o en una sala de emergencias.  Si tiene Engineering geologist, por favor llame inmediatamente al 911 o vaya a la sala de emergencias.  Nmeros de bper  - Dr. Nehemiah Massed: [phone]  - Dra. Moye: [phone]  - Dra. Nicole Kindred: [phone]  En caso de inclemencias del [place], por favor llame a Johnsie Kindred principal al [phone] para una actualizacin sobre el [place] de cualquier retraso o cierre.  Consejos para la medicacin en dermatologa: Por favor, guarde las  cajas en las que vienen los medicamentos de uso tpico para ayudarle a seguir las instrucciones sobre dnde y cmo usarlos. Las farmacias generalmente imprimen las instrucciones del medicamento slo en las cajas y no directamente en los tubos del [place].   Si su medicamento es muy caro, por favor, pngase en contacto con Zigmund Daniel llamando al [phone] y presione la opcin 4 o envenos un mensaje a travs de Pharmacist, community.   No podemos decirle cul ser su copago por los medicamentos por adelantado ya que esto es diferente dependiendo de la cobertura de su seguro. Sin embargo, es posible que podamos encontrar un medicamento sustituto a Electrical engineer un formulario para que el seguro cubra el medicamento que se considera necesario.   Si se requiere una autorizacin previa para que su compaa de seguros [place] su medicamento, por favor permtanos de 1 a 2 das hbiles para completar este proceso.  Los precios de los medicamentos varan con frecuencia dependiendo del Environmental consultant de dnde se surte la receta y [place]  pueden ofrecer precios ms baratos.  El sitio web www.goodrx.com tiene cupones para medicamentos de Airline pilot. Los precios aqu no tienen en cuenta lo que podra costar con la ayuda del seguro (puede ser ms barato con su seguro), pero el sitio web puede darle el precio si no utiliz Research scientist (physical sciences).  - Puede imprimir el cupn correspondiente y llevarlo con su receta a la farmacia.  - Tambin puede pasar por nuestra oficina durante el horario de atencin regular y Charity fundraiser una tarjeta de cupones de GoodRx.  - Si necesita que su receta se enve electrnicamente a una farmacia diferente, informe a nuestra oficina a travs de MyChart de [place] o por telfono llamando al [phone] y presione la opcin 4.

## 2022-02-23 NOTE — Progress Notes (Signed)
   Follow-Up Visit   Subjective  Philip Terry is a 66 y.o. male who presents for the following: Rash (Patient here today for worsening rash at left foot. Started getting worse around Sunday/Monday. Patient is having tenderness at left foot and is starting to limp./).  Patient had bx done last week and results came back as dermatitis. He has been using neosporin to affected area at left great toe.   The following portions of the chart were reviewed this encounter and updated as appropriate:   Tobacco  Allergies  Meds  Problems  Med Hx  Surg Hx  Fam Hx      Review of Systems:  No other skin or systemic complaints except as noted in HPI or Assessment and Plan.  Objective  Well appearing patient in no apparent distress; mood and affect are within normal limits.  A focused examination was performed including legs, feet, abdomen, groin. Relevant physical exam findings are noted in the Assessment and Plan.  Right Thigh - Anterior Scaly pink papules and plaques         Assessment & Plan  Rash and other nonspecific skin eruption Right Thigh - Anterior  Ddx bug bite, allergic contact dermatitis > vasculitis  D/c neosporin  Start clobetasol 0.05% ointment twice daily to aa up to 2 weeks. Avoid applying to face, groin, and axilla. Use as directed. Long-term use can cause thinning of the skin.  Topical steroids (such as triamcinolone, fluocinolone, fluocinonide, mometasone, clobetasol, halobetasol, betamethasone, hydrocortisone) can cause thinning and lightening of the skin if they are used for too long in the same area. Your physician has selected the right strength medicine for your problem and area affected on the body. Please use your medication only as directed by your physician to prevent side effects.   Recommend fragrance free laundry soap.   If not improved by appt next week, consider biopsy.     Related Medications clobetasol ointment (TEMOVATE) 9.62 % Apply 1  Application topically 2 (two) times daily. To affected areas for up to 2 weeks. Avoid applying to face, groin, and axilla. Use as directed. Long-term use can cause thinning of the skin.   Return for Suture Removal, as scheduled.  Graciella Belton, RMA, am acting as scribe for Forest Gleason, MD .  Documentation: I have reviewed the above documentation for accuracy and completeness, and I agree with the above.  Forest Gleason, MD

## 2022-03-01 ENCOUNTER — Ambulatory Visit (INDEPENDENT_AMBULATORY_CARE_PROVIDER_SITE_OTHER): Payer: Medicare HMO | Admitting: Dermatology

## 2022-03-01 ENCOUNTER — Encounter: Payer: Self-pay | Admitting: Dermatology

## 2022-03-01 DIAGNOSIS — R21 Rash and other nonspecific skin eruption: Secondary | ICD-10-CM

## 2022-03-01 NOTE — Progress Notes (Signed)
   Follow-Up Visit   Subjective  Philip Terry is a 66 y.o. male who presents for the following: Suture / Staple Removal (Here for suture removal and discuss pathology results).  The following portions of the chart were reviewed this encounter and updated as appropriate:  Tobacco  Allergies  Meds  Problems  Med Hx  Surg Hx  Fam Hx      Review of Systems: No other skin or systemic complaints except as noted in HPI or Assessment and Plan.   Objective  Well appearing patient in no apparent distress; mood and affect are within normal limits.  A focused examination was performed including face, legs, feet. Relevant physical exam findings are noted in the Assessment and Plan.  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 - Once steri-strips fall off, patient can apply over-the-counter silicone scar cream each night to help with scar remodeling if desired. - Patient advised to call with any concerns or if they notice any new or changing lesions.  Rash legs, torso  Suspect insect bite reactions on feet and possible Id reaction  Use Clobetasol ointment twice daily as directed as needed for new areas of rash or new inset bites. Avoid applying to face, groin, and axilla. Use as directed. Long-term use can cause thinning of the skin.  Topical steroids (such as triamcinolone, fluocinolone, fluocinonide, mometasone, clobetasol, halobetasol, betamethasone, hydrocortisone) can cause thinning and lightening of the skin if they are used for too long in the same area. Your physician has selected the right strength medicine for your problem and area  affected on the body. Please use your medication only as directed by your physician to prevent side effects.     Return if symptoms worsen or fail to improve.  I, Emelia Salisbury, CMA, am acting as scribe for Forest Gleason, MD.  Documentation: I have reviewed the above documentation for accuracy and completeness, and I agree with the above.  Forest Gleason, MD

## 2022-03-01 NOTE — Patient Instructions (Signed)
Use Clobetasol ointment twice daily as directed as needed for new areas of rash or new inset bites.  Avoid applying to face, groin, and axilla. Use as directed. Long-term use can cause thinning of the skin.   Topical steroids (such as triamcinolone, fluocinolone, fluocinonide, mometasone, clobetasol, halobetasol, betamethasone, hydrocortisone) can cause thinning and lightening of the skin if they are used for too long in the same area. Your physician has selected the right strength medicine for your problem and area affected on the body. Please use your medication only as directed by your physician to prevent side effects.     Gentle Skin Care Guide  1. Bathe no more than once a day.  2. Avoid bathing in hot water  3. Use a mild soap like Dove, Vanicream, Cetaphil, CeraVe. Can use Lever 2000 or Cetaphil antibacterial soap  4. Use soap only where you need it. On most days, use it under your arms, between your legs, and on your feet. Let the water rinse other areas unless visibly dirty.  5. When you get out of the bath/shower, use a towel to gently blot your skin dry, don't rub it.  6. While your skin is still a little damp, apply a moisturizing cream such as Vanicream, CeraVe, Cetaphil, Eucerin, Sarna lotion or plain Vaseline Jelly. For hands apply Neutrogena [place] Hand Cream or Excipial Hand Cream.  7. Reapply moisturizer any time you start to itch or feel dry.  8. Sometimes using free and clear laundry detergents can be helpful. Fabric softener sheets should be avoided. Downy Free & Gentle liquid, or any liquid fabric softener that is free of dyes and perfumes, it acceptable to use  9. If your doctor has given you prescription creams you may apply moisturizers over them      Due to recent changes in healthcare laws, you may see results of your pathology and/or laboratory studies on MyChart before the doctors have had a chance to review them. We understand that in some cases there  may be results that are confusing or concerning to you. Please understand that not all results are received at the same time and often the doctors may need to interpret multiple results in order to provide you with the best plan of care or course of treatment. Therefore, we ask that you please give [place] 2 business days to thoroughly review all your results before contacting the office for clarification. Should we see a critical lab result, you will be contacted sooner.   If You Need Anything After Your Visit  If you have any questions or concerns for your doctor, please call our main line at [phone] and press option 4 to reach your doctor's medical assistant. If no one answers, please leave a voicemail as directed and we will return your call as soon as possible. Messages left after 4 pm will be answered the following business day.   You may also send [place] a message via [place]. We typically respond to MyChart messages within 1-2 business days.  For prescription refills, please ask your pharmacy to contact our office. Our fax number is [phone].  If you have an urgent issue when the clinic is closed that cannot wait until the next business day, you can page your doctor at the number below.    Please note that while we do our best to be available for urgent issues outside of office hours, we are not available 24/7.   If you have an urgent issue and are unable  to reach [place], you may choose to seek medical care at your doctor's office, retail clinic, urgent care center, or emergency room.  If you have a medical emergency, please immediately call 911 or go to the emergency department.  Pager Numbers  - Dr. Nehemiah Massed: [phone]  - Dr. Laurence Ferrari: [phone]  - Dr. Nicole Kindred: [phone]  In the event of inclement weather, please call our main line at [phone] for an update on the status of any delays or closures.  Dermatology Medication Tips: Please keep the boxes that topical  medications come in in order to help keep track of the instructions about where and how to use these. Pharmacies typically print the medication instructions only on the boxes and not directly on the medication tubes.   If your medication is too expensive, please contact our office at [phone] option 4 or send [place] a message through [place].   We are unable to tell what your co-pay for medications will be in advance as this is different depending on your insurance coverage. However, we may be able to find a substitute medication at lower cost or fill out paperwork to get insurance to cover a needed medication.   If a prior authorization is required to get your medication covered by your insurance company, please allow [place] 1-2 business days to complete this process.  Drug prices often vary depending on where the prescription is filled and some pharmacies may offer cheaper prices.  The website www.goodrx.com contains coupons for medications through different pharmacies. The prices here do not account for what the cost may be with help from insurance (it may be cheaper with your insurance), but the website can give you the price if you did not use any insurance.  - You can print the associated coupon and take it with your prescription to the pharmacy.  - You may also stop by our office during regular business hours and pick up a GoodRx coupon card.  - If you need your prescription sent electronically to a different pharmacy, notify our office through [facility] or by phone at [phone] option 4.     Si Usted Necesita Algo Despus de Su Visita  Tambin puede enviarnos un mensaje a travs de Pharmacist, community. Por lo general respondemos a los mensajes de MyChart en el transcurso de 1 a 2 das hbiles.  Para renovar recetas, por favor pida a su farmacia que se ponga en contacto con nuestra oficina. Harland Dingwall de fax es [place] [phone].  Si tiene un asunto urgente cuando la clnica est  cerrada y que no puede esperar hasta el siguiente da hbil, puede llamar/localizar a su doctor(a) al nmero que aparece a continuacin.   Por favor, tenga en cuenta que aunque hacemos todo lo posible para estar disponibles para asuntos urgentes fuera del horario de [place], no estamos disponibles las 24 horas del da, los 7 das de la [place].   Si tiene un problema urgente y no puede comunicarse con nosotros, puede optar por buscar atencin mdica  en el consultorio de su doctor(a), en una clnica privada, en un centro de atencin urgente o en una sala de emergencias.  Si tiene Engineering geologist, por favor llame inmediatamente al 911 o vaya a la sala de emergencias.  Nmeros de bper  - Dr. Nehemiah Massed: [phone]  - Dra. Moye: [phone]  - Dra. Nicole Kindred: [phone]  En caso de inclemencias del [place], por favor llame a nuestra lnea principal al [phone] para una actualizacin sobre el [place] de  cualquier retraso o cierre.  Consejos para la medicacin en dermatologa: Por favor, guarde las cajas en las que vienen los medicamentos de uso tpico para ayudarle a seguir las instrucciones sobre dnde y cmo usarlos. Las farmacias generalmente imprimen las instrucciones del medicamento slo en las cajas y no directamente en los tubos del [place].   Si su medicamento es muy caro, por favor, pngase en contacto con Zigmund Daniel llamando al [phone] y presione la opcin 4 o envenos un mensaje a travs de Pharmacist, community.   No podemos decirle cul ser su copago por los medicamentos por adelantado ya que esto es diferente dependiendo de la cobertura de su seguro. Sin embargo, es posible que podamos encontrar un medicamento sustituto a Electrical engineer un formulario para que el seguro cubra el medicamento que se considera necesario.   Si se requiere una autorizacin previa para que su compaa de seguros [place] su medicamento, por favor permtanos de 1 a 2 das hbiles para  completar este proceso.  Los precios de los medicamentos varan con frecuencia dependiendo del Environmental consultant de dnde se surte la receta y alguna farmacias pueden ofrecer precios ms baratos.  El sitio web www.goodrx.com tiene cupones para medicamentos de Airline pilot. Los precios aqu no tienen en cuenta lo que podra costar con la ayuda del seguro (puede ser ms barato con su seguro), pero el sitio web puede darle el precio si no utiliz Research scientist (physical sciences).  - Puede imprimir el cupn correspondiente y llevarlo con su receta a la farmacia.  - Tambin puede pasar por nuestra oficina durante el horario de atencin regular y Charity fundraiser una tarjeta de cupones de GoodRx.  - Si necesita que su receta se enve electrnicamente a una farmacia diferente, informe a nuestra oficina a travs de MyChart de [place] o por telfono llamando al [phone] y presione la opcin 4.

## 2022-03-08 ENCOUNTER — Encounter: Payer: Self-pay | Admitting: Dermatology

## 2022-03-12 ENCOUNTER — Encounter: Payer: Self-pay | Admitting: Dermatology

## 2022-06-01 ENCOUNTER — Ambulatory Visit: Payer: Medicare HMO | Admitting: Dermatology

## 2022-06-01 DIAGNOSIS — L821 Other seborrheic keratosis: Secondary | ICD-10-CM

## 2022-06-01 DIAGNOSIS — L409 Psoriasis, unspecified: Secondary | ICD-10-CM

## 2022-06-01 DIAGNOSIS — D229 Melanocytic nevi, unspecified: Secondary | ICD-10-CM

## 2022-06-01 DIAGNOSIS — L578 Other skin changes due to chronic exposure to nonionizing radiation: Secondary | ICD-10-CM | POA: Diagnosis not present

## 2022-06-01 DIAGNOSIS — L814 Other melanin hyperpigmentation: Secondary | ICD-10-CM

## 2022-06-01 DIAGNOSIS — Z1283 Encounter for screening for malignant neoplasm of skin: Secondary | ICD-10-CM | POA: Diagnosis not present

## 2022-06-01 NOTE — Progress Notes (Signed)
   Follow-Up Visit   Subjective  Philip Terry is a 66 y.o. male who presents for the following: Annual Exam. The patient presents for Total-Body Skin Exam (TBSE) for skin cancer screening and mole check.  The patient has spots, moles and lesions to be evaluated, some may be new or changing and the patient has concerns that these could be cancer.   The following portions of the chart were reviewed this encounter and updated as appropriate:   Tobacco  Allergies  Meds  Problems  Med Hx  Surg Hx  Fam Hx     Review of Systems:  No other skin or systemic complaints except as noted in HPI or Assessment and Plan.  Objective  Well appearing patient in no apparent distress; mood and affect are within normal limits.  A full examination was performed including scalp, head, eyes, ears, nose, lips, neck, chest, axillae, abdomen, back, buttocks, bilateral upper extremities, bilateral lower extremities, hands, feet, fingers, toes, fingernails, and toenails. All findings within normal limits unless otherwise noted below.  knees Mild pink plaque   Assessment & Plan  Psoriasis knees  Psoriasis is a chronic non-curable, but treatable genetic/hereditary disease that may have other systemic features affecting other organ systems such as joints (Psoriatic Arthritis). It is associated with an increased risk of inflammatory bowel disease, heart disease, non-alcoholic fatty liver disease, and depression.     No treatment needed Use good moisturizers daily   Lentigines - Scattered tan macules - Due to sun exposure - Benign-appearing, observe - Recommend daily broad spectrum sunscreen SPF 30+ to sun-exposed areas, reapply every 2 hours as needed. - Call for any changes  Seborrheic Keratoses - Stuck-on, waxy, tan-brown papules and/or plaques  - Benign-appearing - Discussed benign etiology and prognosis. - Observe - Call for any changes  Melanocytic Nevi - Tan-brown and/or pink-flesh-colored  symmetric macules and papules - Benign appearing on exam today - Observation - Call clinic for new or changing moles - Recommend daily use of broad spectrum spf 30+ sunscreen to sun-exposed areas.   Hemangiomas - Red papules - Discussed benign nature - Observe - Call for any changes  Actinic Damage - Chronic condition, secondary to cumulative UV/sun exposure - diffuse scaly erythematous macules with underlying dyspigmentation - Recommend daily broad spectrum sunscreen SPF 30+ to sun-exposed areas, reapply every 2 hours as needed.  - Staying in the shade or wearing long sleeves, sun glasses (UVA+UVB protection) and wide brim hats (4-inch brim around the entire circumference of the hat) are also recommended for sun protection.  - Call for new or changing lesions.  Skin cancer screening performed today.   Return in about 1 year (around 06/02/2023) for TBSE.  IMarye Round, CMA, am acting as scribe for Sarina Ser, MD .  Documentation: I have reviewed the above documentation for accuracy and completeness, and I agree with the above.  Sarina Ser, MD

## 2022-06-01 NOTE — Patient Instructions (Signed)
Due to recent changes in healthcare laws, you may see results of your pathology and/or laboratory studies on MyChart before the doctors have had a chance to review them. We understand that in some cases there may be results that are confusing or concerning to you. Please understand that not all results are received at the same time and often the doctors may need to interpret multiple results in order to provide you with the best plan of care or course of treatment. Therefore, we ask that you please give us 2 business days to thoroughly review all your results before contacting the office for clarification. Should we see a critical lab result, you will be contacted sooner.   If You Need Anything After Your Visit  If you have any questions or concerns for your doctor, please call our main line at [phone] and press option 4 to reach your doctor's medical assistant. If no one answers, please leave a voicemail as directed and we will return your call as soon as possible. Messages left after 4 pm will be answered the following business day.   You may also send us a message via MyChart. We typically respond to MyChart messages within 1-2 business days.  For prescription refills, please ask your pharmacy to contact our office. Our fax number is [phone].  If you have an urgent issue when the clinic is closed that cannot wait until the next business day, you can page your doctor at the number below.    Please note that while we do our best to be available for urgent issues outside of office hours, we are not available 24/7.   If you have an urgent issue and are unable to reach us, you may choose to seek medical care at your doctor's office, retail clinic, urgent care center, or emergency room.  If you have a medical emergency, please immediately call 911 or go to the emergency department.  Pager Numbers  - Dr. Kowalski: [phone]  - Dr. Moye: [phone]  - Dr. Stewart:  [phone]  In the event of inclement weather, please call our main line at [phone] for an update on the status of any delays or closures.  Dermatology Medication Tips: Please keep the boxes that topical medications come in in order to help keep track of the instructions about where and how to use these. Pharmacies typically print the medication instructions only on the boxes and not directly on the medication tubes.   If your medication is too expensive, please contact our office at [phone] option 4 or send us a message through MyChart.   We are unable to tell what your co-pay for medications will be in advance as this is different depending on your insurance coverage. However, we may be able to find a substitute medication at lower cost or fill out paperwork to get insurance to cover a needed medication.   If a prior authorization is required to get your medication covered by your insurance company, please allow us 1-2 business days to complete this process.  Drug prices often vary depending on where the prescription is filled and some pharmacies may offer cheaper prices.  The website www.goodrx.com contains coupons for medications through different pharmacies. The prices here do not account for what the cost may be with help from insurance (it may be cheaper with your insurance), but the website can give you the price if you did not use any insurance.  - You can print the associated coupon and take it with   your prescription to the pharmacy.  - You may also stop by our office during regular business hours and pick up a GoodRx coupon card.  - If you need your prescription sent electronically to a different pharmacy, notify our office through [place] MyChart or by phone at [phone] option 4.     Si Usted Necesita Algo Despus de Su Visita  Tambin puede enviarnos un mensaje a travs de MyChart. Por lo general respondemos a los mensajes de MyChart en el transcurso de 1 a 2  das hbiles.  Para renovar recetas, por favor pida a su farmacia que se ponga en contacto con nuestra oficina. Nuestro nmero de fax es el [phone].  Si tiene un asunto urgente cuando la clnica est cerrada y que no puede esperar hasta el siguiente da hbil, puede llamar/localizar a su doctor(a) al nmero que aparece a continuacin.   Por favor, tenga en cuenta que aunque hacemos todo lo posible para estar disponibles para asuntos urgentes fuera del horario de oficina, no estamos disponibles las 24 horas del da, los 7 das de la semana.   Si tiene un problema urgente y no puede comunicarse con nosotros, puede optar por buscar atencin mdica  en el consultorio de su doctor(a), en una clnica privada, en un centro de atencin urgente o en una sala de emergencias.  Si tiene una emergencia mdica, por favor llame inmediatamente al 911 o vaya a la sala de emergencias.  Nmeros de bper  - Dr. Kowalski: [phone]  - Dra. Moye: [phone]  - Dra. Stewart: [phone]  En caso de inclemencias del tiempo, por favor llame a nuestra lnea principal al [phone] para una actualizacin sobre el estado de cualquier retraso o cierre.  Consejos para la medicacin en dermatologa: Por favor, guarde las cajas en las que vienen los medicamentos de uso tpico para ayudarle a seguir las instrucciones sobre dnde y cmo usarlos. Las farmacias generalmente imprimen las instrucciones del medicamento slo en las cajas y no directamente en los tubos del medicamento.   Si su medicamento es muy caro, por favor, pngase en contacto con nuestra oficina llamando al [phone] y presione la opcin 4 o envenos un mensaje a travs de MyChart.   No podemos decirle cul ser su copago por los medicamentos por adelantado ya que esto es diferente dependiendo de la cobertura de su seguro. Sin embargo, es posible que podamos encontrar un medicamento sustituto a menor costo o llenar un formulario para que el  seguro cubra el medicamento que se considera necesario.   Si se requiere una autorizacin previa para que su compaa de seguros cubra su medicamento, por favor permtanos de 1 a 2 das hbiles para completar este proceso.  Los precios de los medicamentos varan con frecuencia dependiendo del lugar de dnde se surte la receta y alguna farmacias pueden ofrecer precios ms baratos.  El sitio web www.goodrx.com tiene cupones para medicamentos de diferentes farmacias. Los precios aqu no tienen en cuenta lo que podra costar con la ayuda del seguro (puede ser ms barato con su seguro), pero el sitio web puede darle el precio si no utiliz ningn seguro.  - Puede imprimir el cupn correspondiente y llevarlo con su receta a la farmacia.  - Tambin puede pasar por nuestra oficina durante el horario de atencin regular y recoger una tarjeta de cupones de GoodRx.  - Si necesita que su receta se enve electrnicamente a una farmacia diferente, informe a nuestra oficina a travs de MyChart de [place]   o por telfono llamando al [phone] y presione la opcin 4.  

## 2022-06-11 ENCOUNTER — Encounter: Payer: Self-pay | Admitting: Dermatology

## 2022-11-02 ENCOUNTER — Ambulatory Visit: Payer: Medicare HMO | Admitting: Dermatology

## 2022-11-02 VITALS — BP 122/69 | HR 69

## 2022-11-02 DIAGNOSIS — L578 Other skin changes due to chronic exposure to nonionizing radiation: Secondary | ICD-10-CM

## 2022-11-02 DIAGNOSIS — L820 Inflamed seborrheic keratosis: Secondary | ICD-10-CM

## 2022-11-02 DIAGNOSIS — W908XXA Exposure to other nonionizing radiation, initial encounter: Secondary | ICD-10-CM | POA: Diagnosis not present

## 2022-11-02 DIAGNOSIS — X32XXXA Exposure to sunlight, initial encounter: Secondary | ICD-10-CM

## 2022-11-02 DIAGNOSIS — L821 Other seborrheic keratosis: Secondary | ICD-10-CM | POA: Diagnosis not present

## 2022-11-02 NOTE — Progress Notes (Signed)
   Follow-Up Visit   Subjective  Philip Terry is a 67 y.o. male who presents for the following: Spot, on the scalp, irritated, patient concerned and would like it checked.  The patient has spots, moles and lesions to be evaluated, some may be new or changing and the patient has concerns that these could be cancer.   The following portions of the chart were reviewed this encounter and updated as appropriate: medications, allergies, medical history  Review of Systems:  No other skin or systemic complaints except as noted in HPI or Assessment and Plan.  Objective  Well appearing patient in no apparent distress; mood and affect are within normal limits.  A focused examination was performed of the following areas: The face and scalp Relevant physical exam findings are noted in the Assessment and Plan.  R scalp x 2 (2) Erythematous stuck-on, waxy papule or plaque    Assessment & Plan   Inflamed seborrheic keratosis (2) R scalp x 2  Destruction of lesion - R scalp x 2 Complexity: simple   Destruction method: cryotherapy   Informed consent: discussed and consent obtained   Timeout:  patient name, date of birth, surgical site, and procedure verified Lesion destroyed using liquid nitrogen: Yes   Region frozen until ice ball extended beyond lesion: Yes   Outcome: patient tolerated procedure well with no complications   Post-procedure details: wound care instructions given    SEBORRHEIC KERATOSIS - Stuck-on, waxy, tan-brown papules and/or plaques  - Benign-appearing - Discussed benign etiology and prognosis. - Observe - Call for any changes  ACTINIC DAMAGE - chronic, secondary to cumulative UV radiation exposure/sun exposure over time - diffuse scaly erythematous macules with underlying dyspigmentation - Recommend daily broad spectrum sunscreen SPF 30+ to sun-exposed areas, reapply every 2 hours as needed.  - Recommend staying in the shade or wearing long sleeves, sun glasses  (UVA+UVB protection) and wide brim hats (4-inch brim around the entire circumference of the hat). - Call for new or changing lesions.  Return for appointment as scheduled.  Maylene Roes, CMA, am acting as scribe for Armida Sans, MD .   Documentation: I have reviewed the above documentation for accuracy and completeness, and I agree with the above.  Armida Sans, MD

## 2022-11-02 NOTE — Patient Instructions (Signed)
Due to recent changes in healthcare laws, you may see results of your pathology and/or laboratory studies on MyChart before the doctors have had a chance to review them. We understand that in some cases there may be results that are confusing or concerning to you. Please understand that not all results are received at the same time and often the doctors may need to interpret multiple results in order to provide you with the best plan of care or course of treatment. Therefore, we ask that you please give us 2 business days to thoroughly review all your results before contacting the office for clarification. Should we see a critical lab result, you will be contacted sooner.   If You Need Anything After Your Visit  If you have any questions or concerns for your doctor, please call our main line at [phone] and press option 4 to reach your doctor's medical assistant. If no one answers, please leave a voicemail as directed and we will return your call as soon as possible. Messages left after 4 pm will be answered the following business day.   You may also send us a message via MyChart. We typically respond to MyChart messages within 1-2 business days.  For prescription refills, please ask your pharmacy to contact our office. Our fax number is [phone].  If you have an urgent issue when the clinic is closed that cannot wait until the next business day, you can page your doctor at the number below.    Please note that while we do our best to be available for urgent issues outside of office hours, we are not available 24/7.   If you have an urgent issue and are unable to reach us, you may choose to seek medical care at your doctor's office, retail clinic, urgent care center, or emergency room.  If you have a medical emergency, please immediately call 911 or go to the emergency department.  Pager Numbers  - Dr. Kowalski: [phone]  - Dr. Moye: [phone]  - Dr. Stewart:  [phone]  In the event of inclement weather, please call our main line at [phone] for an update on the status of any delays or closures.  Dermatology Medication Tips: Please keep the boxes that topical medications come in in order to help keep track of the instructions about where and how to use these. Pharmacies typically print the medication instructions only on the boxes and not directly on the medication tubes.   If your medication is too expensive, please contact our office at [phone] option 4 or send us a message through MyChart.   We are unable to tell what your co-pay for medications will be in advance as this is different depending on your insurance coverage. However, we may be able to find a substitute medication at lower cost or fill out paperwork to get insurance to cover a needed medication.   If a prior authorization is required to get your medication covered by your insurance company, please allow us 1-2 business days to complete this process.  Drug prices often vary depending on where the prescription is filled and some pharmacies may offer cheaper prices.  The website www.goodrx.com contains coupons for medications through different pharmacies. The prices here do not account for what the cost may be with help from insurance (it may be cheaper with your insurance), but the website can give you the price if you did not use any insurance.  - You can print the associated coupon and take it with   your prescription to the pharmacy.  - You may also stop by our office during regular business hours and pick up a GoodRx coupon card.  - If you need your prescription sent electronically to a different pharmacy, notify our office through [facility] MyChart or by phone at [phone] option 4.     Si Usted Necesita Algo Despus de Su Visita  Tambin puede enviarnos un mensaje a travs de MyChart. Por lo general respondemos a los mensajes de MyChart en el transcurso de 1 a 2  das hbiles.  Para renovar recetas, por favor pida a su farmacia que se ponga en contacto con nuestra oficina. Nuestro nmero de fax es el [phone].  Si tiene un asunto urgente cuando la clnica est cerrada y que no puede esperar hasta el siguiente da hbil, puede llamar/localizar a su doctor(a) al nmero que aparece a continuacin.   Por favor, tenga en cuenta que aunque hacemos todo lo posible para estar disponibles para asuntos urgentes fuera del horario de oficina, no estamos disponibles las 24 horas del da, los 7 das de la semana.   Si tiene un problema urgente y no puede comunicarse con nosotros, puede optar por buscar atencin mdica  en el consultorio de su doctor(a), en una clnica privada, en un centro de atencin urgente o en una sala de emergencias.  Si tiene una emergencia mdica, por favor llame inmediatamente al 911 o vaya a la sala de emergencias.  Nmeros de bper  - Dr. Kowalski: [phone]  - Dra. Moye: [phone]  - Dra. Stewart: [phone]  En caso de inclemencias del tiempo, por favor llame a nuestra lnea principal al [phone] para una actualizacin sobre el estado de cualquier retraso o cierre.  Consejos para la medicacin en dermatologa: Por favor, guarde las cajas en las que vienen los medicamentos de uso tpico para ayudarle a seguir las instrucciones sobre dnde y cmo usarlos. Las farmacias generalmente imprimen las instrucciones del medicamento slo en las cajas y no directamente en los tubos del medicamento.   Si su medicamento es muy caro, por favor, pngase en contacto con nuestra oficina llamando al [phone] y presione la opcin 4 o envenos un mensaje a travs de MyChart.   No podemos decirle cul ser su copago por los medicamentos por adelantado ya que esto es diferente dependiendo de la cobertura de su seguro. Sin embargo, es posible que podamos encontrar un medicamento sustituto a menor costo o llenar un formulario para que el  seguro cubra el medicamento que se considera necesario.   Si se requiere una autorizacin previa para que su compaa de seguros cubra su medicamento, por favor permtanos de 1 a 2 das hbiles para completar este proceso.  Los precios de los medicamentos varan con frecuencia dependiendo del lugar de dnde se surte la receta y alguna farmacias pueden ofrecer precios ms baratos.  El sitio web www.goodrx.com tiene cupones para medicamentos de diferentes farmacias. Los precios aqu no tienen en cuenta lo que podra costar con la ayuda del seguro (puede ser ms barato con su seguro), pero el sitio web puede darle el precio si no utiliz ningn seguro.  - Puede imprimir el cupn correspondiente y llevarlo con su receta a la farmacia.  - Tambin puede pasar por nuestra oficina durante el horario de atencin regular y recoger una tarjeta de cupones de GoodRx.  - Si necesita que su receta se enve electrnicamente a una farmacia diferente, informe a nuestra oficina a travs de MyChart de [facility]   o por telfono llamando al [phone] y presione la opcin 4.  

## 2022-11-11 ENCOUNTER — Encounter: Payer: Self-pay | Admitting: Dermatology

## 2023-06-07 ENCOUNTER — Ambulatory Visit: Payer: Medicare HMO | Admitting: Dermatology

## 2023-07-04 ENCOUNTER — Other Ambulatory Visit: Payer: Self-pay | Admitting: Internal Medicine

## 2023-07-04 DIAGNOSIS — Z0000 Encounter for general adult medical examination without abnormal findings: Secondary | ICD-10-CM

## 2023-07-04 DIAGNOSIS — E782 Mixed hyperlipidemia: Secondary | ICD-10-CM

## 2023-07-16 ENCOUNTER — Ambulatory Visit
Admission: RE | Admit: 2023-07-16 | Discharge: 2023-07-16 | Disposition: A | Discharge status: Home / Self Care | Payer: Self-pay | Source: Ambulatory Visit | Attending: Internal Medicine | Admitting: Internal Medicine

## 2023-07-16 DIAGNOSIS — Z0000 Encounter for general adult medical examination without abnormal findings: Secondary | ICD-10-CM | POA: Insufficient documentation

## 2023-07-16 DIAGNOSIS — E782 Mixed hyperlipidemia: Secondary | ICD-10-CM | POA: Insufficient documentation

## 2023-10-10 ENCOUNTER — Ambulatory Visit: Payer: Medicare HMO | Admitting: Dermatology

## 2023-10-10 ENCOUNTER — Encounter: Payer: Self-pay | Admitting: Dermatology

## 2023-10-10 DIAGNOSIS — Z1283 Encounter for screening for malignant neoplasm of skin: Secondary | ICD-10-CM | POA: Diagnosis not present

## 2023-10-10 DIAGNOSIS — D2271 Melanocytic nevi of right lower limb, including hip: Secondary | ICD-10-CM

## 2023-10-10 DIAGNOSIS — W908XXA Exposure to other nonionizing radiation, initial encounter: Secondary | ICD-10-CM

## 2023-10-10 DIAGNOSIS — D492 Neoplasm of unspecified behavior of bone, soft tissue, and skin: Secondary | ICD-10-CM | POA: Diagnosis not present

## 2023-10-10 DIAGNOSIS — L578 Other skin changes due to chronic exposure to nonionizing radiation: Secondary | ICD-10-CM

## 2023-10-10 DIAGNOSIS — D485 Neoplasm of uncertain behavior of skin: Secondary | ICD-10-CM

## 2023-10-10 DIAGNOSIS — D229 Melanocytic nevi, unspecified: Secondary | ICD-10-CM

## 2023-10-10 DIAGNOSIS — L814 Other melanin hyperpigmentation: Secondary | ICD-10-CM

## 2023-10-10 DIAGNOSIS — L821 Other seborrheic keratosis: Secondary | ICD-10-CM

## 2023-10-10 DIAGNOSIS — D1801 Hemangioma of skin and subcutaneous tissue: Secondary | ICD-10-CM

## 2023-10-10 NOTE — Patient Instructions (Addendum)

## 2023-10-10 NOTE — Progress Notes (Signed)
   Follow-Up Visit   Subjective  Philip Terry is a 68 y.o. male who presents for the following: Skin Cancer Screening and Full Body Skin Exam  The patient presents for Total-Body Skin Exam (TBSE) for skin cancer screening and mole check. The patient has spots, moles and lesions to be evaluated, some may be new or changing and the patient may have concern these could be cancer.  The following portions of the chart were reviewed this encounter and updated as appropriate: medications, allergies, medical history  Review of Systems:  No other skin or systemic complaints except as noted in HPI or Assessment and Plan.  Objective  Well appearing patient in no apparent distress; mood and affect are within normal limits.  A full examination was performed including scalp, head, eyes, ears, nose, lips, neck, chest, axillae, abdomen, back, buttocks, bilateral upper extremities, bilateral lower extremities, hands, feet, fingers, toes, fingernails, and toenails. All findings within normal limits unless otherwise noted below.   Relevant physical exam findings are noted in the Assessment and Plan.  R med post thigh Irregular brown macule 0.6 cm   Assessment & Plan   SKIN CANCER SCREENING PERFORMED TODAY.  ACTINIC DAMAGE - Chronic condition, secondary to cumulative UV/sun exposure - diffuse scaly erythematous macules with underlying dyspigmentation - Recommend daily broad spectrum sunscreen SPF 30+ to sun-exposed areas, reapply every 2 hours as needed.  - Staying in the shade or wearing long sleeves, sun glasses (UVA+UVB protection) and wide brim hats (4-inch brim around the entire circumference of the hat) are also recommended for sun protection.  - Call for new or changing lesions.  LENTIGINES, SEBORRHEIC KERATOSES, HEMANGIOMAS - Benign normal skin lesions - Benign-appearing - Call for any changes  MELANOCYTIC NEVI - Tan-brown and/or pink-flesh-colored symmetric macules and papules - Benign  appearing on exam today - Observation - Call clinic for new or changing moles - Recommend daily use of broad spectrum spf 30+ sunscreen to sun-exposed areas.  NEOPLASM OF UNCERTAIN BEHAVIOR OF SKIN R med post thigh Epidermal / dermal shaving  Lesion diameter (cm):  0.6 Informed consent: discussed and consent obtained   Timeout: patient name, date of birth, surgical site, and procedure verified   Procedure prep:  Patient was prepped and draped in usual sterile fashion Prep type:  Isopropyl alcohol Anesthesia: the lesion was anesthetized in a standard fashion   Anesthetic:  1% lidocaine w/ epinephrine 1-100,000 buffered w/ 8.4% NaHCO3 Instrument used: flexible razor blade   Hemostasis achieved with: pressure, aluminum chloride and electrodesiccation   Outcome: patient tolerated procedure well   Post-procedure details: sterile dressing applied and wound care instructions given   Dressing type: bandage (Mupirocin 2% ointment)   Specimen 1 - Surgical pathology Differential Diagnosis: D48.5 r/o dysplastic nevus Check Margins: Yes  Return in about 1 year (around 10/09/2024) for TBSE.  Arlinda Lais, CMA, am acting as scribe for Celine Collard, MD .   Documentation: I have reviewed the above documentation for accuracy and completeness, and I agree with the above.  Celine Collard, MD

## 2023-10-19 LAB — SURGICAL PATHOLOGY

## 2023-10-22 ENCOUNTER — Encounter: Payer: Self-pay | Admitting: Dermatology

## 2023-10-23 ENCOUNTER — Telehealth: Payer: Self-pay

## 2023-10-23 NOTE — Telephone Encounter (Addendum)
 Tried calling patient regarding results. No answer. Lm for patient to return call.   ----- Message from Celine Collard sent at 10/22/2023  6:26 PM EDT ----- FINAL DIAGNOSIS        1. Skin, R med post thigh :       MELANOCYTIC NEVUS, JUNCTIONAL LENTIGINOUS TYPE, LIMITED MARGINS FREE   Benign mole No further treatment needed

## 2023-10-24 ENCOUNTER — Telehealth: Payer: Self-pay

## 2023-10-24 NOTE — Telephone Encounter (Addendum)
 Called and discuss bx results with patient. He verbalized understanding and denied further questions.   ----- Message from Celine Collard sent at 10/22/2023  6:26 PM EDT ----- FINAL DIAGNOSIS        1. Skin, R med post thigh :       MELANOCYTIC NEVUS, JUNCTIONAL LENTIGINOUS TYPE, LIMITED MARGINS FREE   Benign mole No further treatment needed

## 2024-10-14 ENCOUNTER — Ambulatory Visit: Admitting: Dermatology
